# Patient Record
Sex: Male | Born: 2000 | Race: White | Hispanic: No | Marital: Single | Smoking: Never smoker
Health system: Southern US, Community
[De-identification: ages and names within clinical notes are randomized; demographics above are authoritative.]

## PROBLEM LIST (undated history)

## (undated) DIAGNOSIS — F419 Anxiety disorder, unspecified: Secondary | ICD-10-CM

## (undated) DIAGNOSIS — F909 Attention-deficit hyperactivity disorder, unspecified type: Secondary | ICD-10-CM

## (undated) DIAGNOSIS — F329 Major depressive disorder, single episode, unspecified: Secondary | ICD-10-CM

## (undated) DIAGNOSIS — F32A Depression, unspecified: Secondary | ICD-10-CM

---
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
## 2022-08-08 ENCOUNTER — Institutional Professional Consult (permissible substitution): Payer: No Typology Code available for payment source | Admitting: Pediatrics
